# Patient Record
Sex: Male | Born: 1985 | Race: White | Hispanic: No | Marital: Married | Smoking: Current some day smoker
Health system: Southern US, Community
[De-identification: ages and names within clinical notes are randomized; demographics above are authoritative.]

## PROBLEM LIST (undated history)

## (undated) DIAGNOSIS — F419 Anxiety disorder, unspecified: Secondary | ICD-10-CM

## (undated) DIAGNOSIS — M549 Dorsalgia, unspecified: Secondary | ICD-10-CM

## (undated) DIAGNOSIS — F4310 Post-traumatic stress disorder, unspecified: Secondary | ICD-10-CM

---

## 2020-08-31 ENCOUNTER — Emergency Department

## 2020-08-31 ENCOUNTER — Emergency Department
Admission: EM | Admit: 2020-08-31 | Discharge: 2020-08-31 | Disposition: A | Discharge status: Home / Self Care | Attending: Emergency Medicine | Admitting: Emergency Medicine

## 2020-08-31 ENCOUNTER — Encounter: Payer: Self-pay | Admitting: Emergency Medicine

## 2020-08-31 ENCOUNTER — Other Ambulatory Visit: Payer: Self-pay

## 2020-08-31 DIAGNOSIS — F17210 Nicotine dependence, cigarettes, uncomplicated: Secondary | ICD-10-CM | POA: Insufficient documentation

## 2020-08-31 DIAGNOSIS — K219 Gastro-esophageal reflux disease without esophagitis: Secondary | ICD-10-CM | POA: Insufficient documentation

## 2020-08-31 DIAGNOSIS — R079 Chest pain, unspecified: Secondary | ICD-10-CM | POA: Diagnosis present

## 2020-08-31 DIAGNOSIS — R0789 Other chest pain: Secondary | ICD-10-CM

## 2020-08-31 HISTORY — DX: Anxiety disorder, unspecified: F41.9

## 2020-08-31 HISTORY — DX: Dorsalgia, unspecified: M54.9

## 2020-08-31 HISTORY — DX: Post-traumatic stress disorder, unspecified: F43.10

## 2020-08-31 LAB — COMPREHENSIVE METABOLIC PANEL
ALT: 46 U/L — ABNORMAL HIGH (ref 0–44)
AST: 43 U/L — ABNORMAL HIGH (ref 15–41)
Albumin: 4.6 g/dL (ref 3.5–5.0)
Alkaline Phosphatase: 56 U/L (ref 38–126)
Anion gap: 6 (ref 5–15)
BUN: 10 mg/dL (ref 6–20)
CO2: 28 mmol/L (ref 22–32)
Calcium: 8.8 mg/dL — ABNORMAL LOW (ref 8.9–10.3)
Chloride: 105 mmol/L (ref 98–111)
Creatinine, Ser: 0.85 mg/dL (ref 0.61–1.24)
GFR, Estimated: >60 mL/min (ref >60)
Glucose, Bld: 114 mg/dL — ABNORMAL HIGH (ref 70–99)
Potassium: 3.8 mmol/L (ref 3.5–5.1)
Sodium: 139 mmol/L (ref 135–145)
Total Bilirubin: 1 mg/dL (ref 0.3–1.2)
Total Protein: 7.6 g/dL (ref 6.5–8.1)

## 2020-08-31 LAB — CBC
HCT: 42.7 % (ref 39.0–52.0)
Hemoglobin: 15.8 g/dL (ref 13.0–17.0)
MCH: 32.2 pg (ref 26.0–34.0)
MCHC: 37 g/dL — ABNORMAL HIGH (ref 30.0–36.0)
MCV: 87 fL (ref 80.0–100.0)
Platelets: 212 10*3/uL (ref 150–400)
RBC: 4.91 MIL/uL (ref 4.22–5.81)
RDW: 12.3 % (ref 11.5–15.5)
WBC: 5.6 10*3/uL (ref 4.0–10.5)
nRBC: 0 % (ref 0.0–0.2)

## 2020-08-31 LAB — TROPONIN I (HIGH SENSITIVITY)
Troponin I (High Sensitivity): <2 ng/L (ref <18)
Troponin I (High Sensitivity): <2 ng/L (ref <18)

## 2020-08-31 NOTE — Discharge Instructions (Addendum)
As we discussed, your evaluation and test results were reassuring today.  Rather than this being an issue with your lungs or heart, it is more likely an issue of acid reflux.  Consider trying a medication such as Prilosec or another over-the-counter reflux medication for the next couple of weeks.  Follow-up with your regular doctor at the next available opportunity.  Return to the emergency department if you develop new or worsening symptoms that concern you.

## 2020-08-31 NOTE — ED Triage Notes (Signed)
Pt in with co chest pain states worse when he moves. Hx of pneumonia states feels the same. Denies any cough but does co chills. Pt unsure of fever at home, no injury.

## 2020-08-31 NOTE — ED Provider Notes (Signed)
[facility] Emergency Department Provider Note  ____________________________________________   Event Date/Time   First MD Initiated Contact with Patient 08/31/20 0423     (approximate)  I have reviewed the triage vital signs and the nursing notes.   HISTORY  Chief Complaint Chest Pain    HPI Lee Mccarthy is a 35 y.o. male who presents for evaluation of some burning chest pain that he had earlier tonight.  He only noticed it after he laid down and it felt like it was moving up through the center and right part of his chest.  It is gone now that he is sitting up.  He said he has had milder symptoms in the past.  He had pneumonia previously and it felt somewhat similar so he was concerned that maybe he was getting sick but he has not had any shortness of breath.  He states that the more he thinks about the more he thinks that this could be acid reflux because it only happened when he was lying down and he has some burning up in the bottom of his throat as well.  He has no history of heart problems or blood clots in the legs of the lungs.  He smokes rarely and vapes sometimes.  He denies nausea, vomiting, abdominal pain, and dysuria.  He does not take a PPI or other medication for acid reflux.  Nothing in particular seem to make the symptoms better or worse although he feels better when sitting up.     Past Medical History:  Diagnosis Date   Anxiety    Back pain    PTSD (post-traumatic stress disorder)     There are no problems to display for this patient.   History reviewed. No pertinent surgical history.  Prior to Admission medications   Not on File    Allergies Penicillins  History reviewed. No pertinent family history.  Social History Social History   Tobacco Use   Smoking status: Some Days    Pack years: 0.00    Types: Cigarettes   Smokeless tobacco: Never    Review of Systems Constitutional: No fever/chills Eyes: No visual  changes. ENT: Patient describes a sore throat at the base of his throat. Cardiovascular: Chest versus upper abdominal pain as described above. Respiratory: Denies shortness of breath. Gastrointestinal: Chest versus upper abdominal pain as described above.  No nausea nor vomiting.   Genitourinary: Negative for dysuria. Musculoskeletal: Negative for neck pain.  Negative for back pain. Integumentary: Negative for rash. Neurological: Negative for headaches, focal weakness or numbness.   ____________________________________________   PHYSICAL EXAM:  VITAL SIGNS: ED Triage Vitals  Enc Vitals Group     BP 08/31/20 0208 122/84     Pulse Rate 08/31/20 0208 (!) 102     Resp 08/31/20 0208 20     Temp 08/31/20 0208 98.5 F (36.9 C)     Temp Source 08/31/20 0208 Oral     SpO2 08/31/20 0208 98 %     Weight 08/31/20 0204 83.5 kg (184 lb)     Height 08/31/20 0204 1.753 m (5\' 9" )     Head Circumference --      Peak Flow --      Pain Score 08/31/20 0204 0     Pain Loc --      Pain Edu? --      Excl. in GC? --     Constitutional: Alert and oriented.  Eyes: Conjunctivae are normal.  Head: Atraumatic. Nose: No  congestion/rhinnorhea. Mouth/Throat: Patient is wearing a mask. Neck: No stridor.  No meningeal signs.   Cardiovascular: Normal rate, regular rhythm. Good peripheral circulation. Respiratory: Normal respiratory effort.  No retractions. Gastrointestinal: Soft and nontender. No distention.  Musculoskeletal: No lower extremity tenderness nor edema. No gross deformities of extremities. Neurologic:  Normal speech and language. No gross focal neurologic deficits are appreciated.  Skin:  Skin is warm, dry and intact. Psychiatric: Mood and affect are normal. Speech and behavior are normal.  ____________________________________________   LABS (all labs ordered are listed, but only abnormal results are displayed)  Labs Reviewed  CBC - Abnormal; Notable for the following components:       Result Value   MCHC 37.0 (*)    All other components within normal limits  COMPREHENSIVE METABOLIC PANEL - Abnormal; Notable for the following components:   Glucose, Bld 114 (*)    Calcium 8.8 (*)    AST 43 (*)    ALT 46 (*)    All other components within normal limits  TROPONIN I (HIGH SENSITIVITY)  TROPONIN I (HIGH SENSITIVITY)   ____________________________________________  EKG  ED ECG REPORT I, Loleta Rose, the attending physician, personally viewed and interpreted this ECG.  Date: 08/31/2020 EKG Time: 2:07 Rate: 96 Rhythm: normal sinus rhythm QRS Axis: normal Intervals: normal ST/T Wave abnormalities: normal Narrative Interpretation: no evidence of acute ischemia   ____________________________________________  RADIOLOGY I, Loleta Rose, personally viewed and evaluated these images (plain radiographs) as part of my medical decision making, as well as reviewing the written report by the radiologist.  ED MD interpretation: No acute abnormalities  Official radiology report(s): DG Chest 2 View  Result Date: 08/31/2020 CLINICAL DATA:  Chest pain. chest pain states worse when he moves. Hx of pneumonia states feels the same. Denies any cough but does co chills. Pt unsure of fever at home, no injury. EXAM: CHEST - 2 VIEW COMPARISON:  None. FINDINGS: The heart size and mediastinal contours are within normal limits. No focal consolidation. No pulmonary edema. No pleural effusion. No pneumothorax. No acute osseous abnormality. IMPRESSION: No active cardiopulmonary disease. Electronically Signed   By: Tish Frederickson M.D.   On: 08/31/2020 03:10    ____________________________________________   PROCEDURES   Procedure(s) performed (including Critical Care):  Procedures   ____________________________________________   INITIAL IMPRESSION / MDM / ASSESSMENT AND PLAN / ED COURSE  As part of my medical decision making, I reviewed the following data within the electronic  MEDICAL RECORD NUMBER Nursing notes reviewed and incorporated, Labs reviewed , EKG interpreted , Old chart reviewed, Radiograph reviewed , and Notes from prior ED visits   Differential diagnosis includes, but is not limited to, acid reflux, nonspecific atypical chest pain, musculoskeletal pain including costochondritis, pneumonia, ACS, PE.  Patient's vital signs are stable and within normal limits.  Nonischemic EKG.  PERC negative.  CBC and high-sensitivity troponin x2 are all normal.  Comprehensive metabolic panel is notable for very slight elevation of his AST and ALT, but the patient says that he drinks alcohol pretty regularly and has been told in the past that his liver function tests are often elevated and that this is actually better than usual.  The patient thinks that most likely he was suffering from acid reflux and I think that is likely, with an underlying diagnosis of anxiety as well.  He is comfortable with the plan to go home and I made some recommendations in terms of over-the-counter PPIs and close outpatient follow-up.  He understands  and agrees with the plan.  No indication of emergent medical condition at this time.           ____________________________________________  FINAL CLINICAL IMPRESSION(S) / ED DIAGNOSES  Final diagnoses:  Atypical chest pain  Gastroesophageal reflux disease, unspecified whether esophagitis present     MEDICATIONS GIVEN DURING THIS VISIT:  Medications - No data to display   ED Discharge Orders     None        Note:  This document was prepared using Dragon voice recognition software and may include unintentional dictation errors.   Loleta Rose, MD 08/31/20 [phone]

## 2022-08-09 IMAGING — CR DG CHEST 2V
2 series · 2 of 2 positions shown · non-contrast
Comparison: None.

CLINICAL DATA: Chest pain. chest pain states worse when he moves.
Hx of pneumonia states feels the same. Denies any cough but does co
chills. Pt unsure of fever at home, no injury.

EXAM:
CHEST - 2 VIEW

[chest pa]
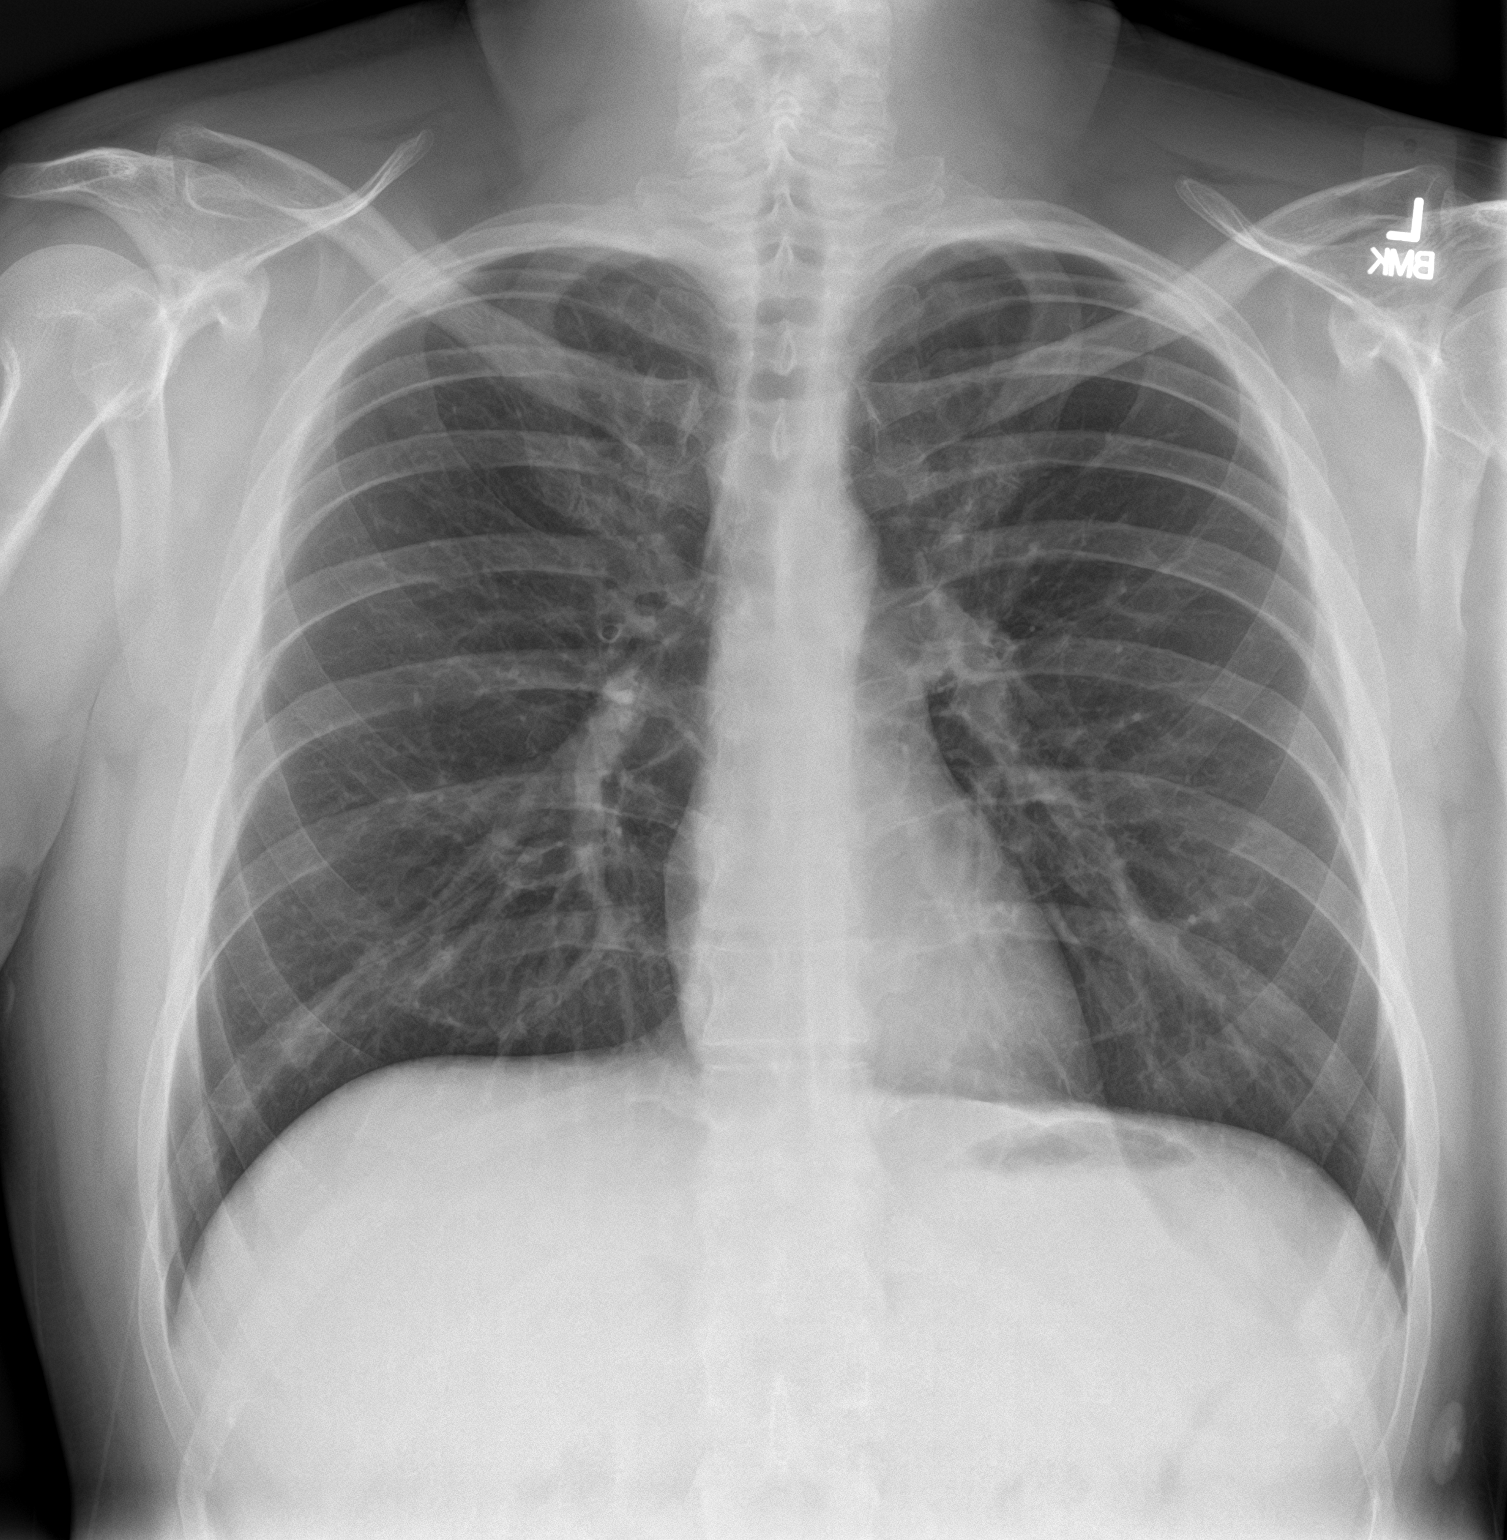

[chest lat]
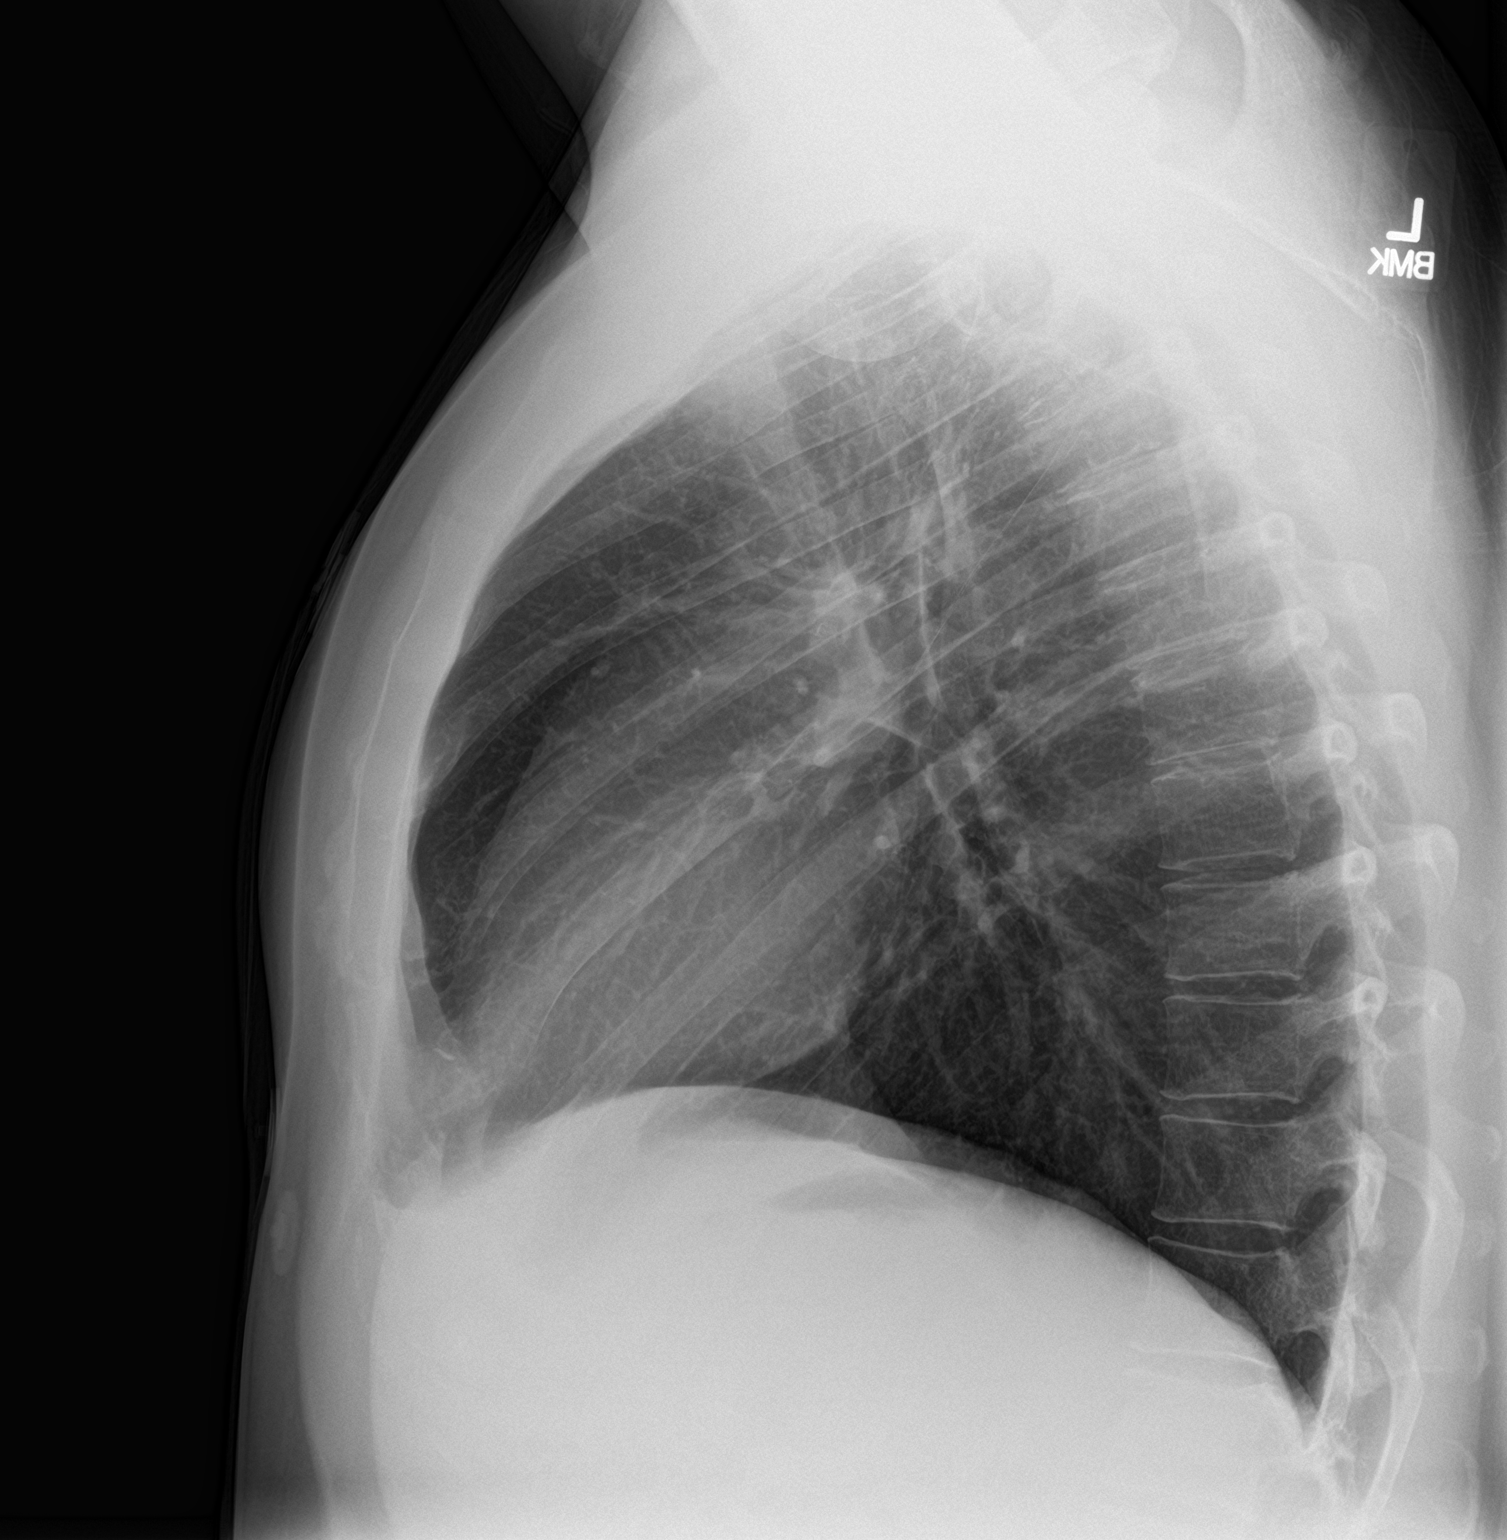

[2 of 2 positions shown; findings below may reference images not displayed]

FINDINGS: The heart size and mediastinal contours are within normal limits.

No focal consolidation. No pulmonary edema. No pleural effusion. No
pneumothorax.

No acute osseous abnormality.
IMPRESSION: No active cardiopulmonary disease.
